# Patient Record
Sex: Male | Born: 2000 | Race: White | Hispanic: No | Marital: Single | Smoking: Never smoker
Health system: Southern US, Community
[De-identification: ages and names within clinical notes are randomized; demographics above are authoritative.]

## PROBLEM LIST (undated history)

## (undated) HISTORY — PX: FRACTURE SURGERY: SHX138

---
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
## 2020-10-06 ENCOUNTER — Ambulatory Visit (INDEPENDENT_AMBULATORY_CARE_PROVIDER_SITE_OTHER): Payer: Medicaid Other

## 2020-10-06 ENCOUNTER — Ambulatory Visit (HOSPITAL_COMMUNITY)
Admission: EM | Admit: 2020-10-06 | Discharge: 2020-10-06 | Disposition: A | Discharge status: Home / Self Care | Payer: Medicaid Other | Attending: Sports Medicine | Admitting: Sports Medicine

## 2020-10-06 ENCOUNTER — Encounter (HOSPITAL_COMMUNITY): Payer: Self-pay | Admitting: Emergency Medicine

## 2020-10-06 ENCOUNTER — Other Ambulatory Visit: Payer: Self-pay

## 2020-10-06 DIAGNOSIS — S93402A Sprain of unspecified ligament of left ankle, initial encounter: Secondary | ICD-10-CM

## 2020-10-06 DIAGNOSIS — M25572 Pain in left ankle and joints of left foot: Secondary | ICD-10-CM | POA: Diagnosis not present

## 2020-10-06 NOTE — ED Triage Notes (Signed)
Pt presents with left ankle pain after falling while skateboarding yesterday.

## 2020-10-06 NOTE — ED Provider Notes (Signed)
[facility]    CSN: [id number] Arrival date & time: 10/06/20  1625      History   Chief Complaint Chief Complaint  Patient presents with   Ankle Pain    Left    HPI Todd Farmer is a 20 y.o. male who presents for left ankle pain since yesterday, s/p fall from skateboard.  The patient states that yesterday he was skateboarding on a quarter pipe, when he went up and came to laying back down but slid off of the board as there were people standing in his landing area.  He landed off of the board and describes an inversion like ankle injury of the left foot.  He had immediate pain and felt 2 "pops" on the lateral side of his ankle. He was able  to ambulate shortly after, but it was somewhat painful.  He went to bed, and early this morning he woke up with a very swollen ankle and significant pain trying to step to walk.  He took 2 tablets of ibuprofen last night and again today with mild pain relief.  He does note significant swelling on the lateral aspect of the ankle.  Denies any erythema, open cuts, or ecchymosis noted.  His pain is located over the left lateral ankle, he denies any pain in the midfoot or digits of the foot.  His pain is worse with activity, walking, eversion/inversion of the ankle. Reports history of prior ankle sprain a few years ago on same ankle.  History reviewed. No pertinent past medical history.  There are no problems to display for this patient.   History reviewed. No pertinent surgical history.     Home Medications    Prior to Admission medications   Not on File    Family History Family History  Problem Relation Age of Onset   Asthma Mother    Healthy Father     Social History Social History   Tobacco Use   Smoking status: Never   Smokeless tobacco: Never  Substance Use Topics   Alcohol use: Yes    Comment: occasionally   Drug use: Never     Allergies   Patient has no known allergies.   Review of Systems Review of  Systems  Constitutional:  Negative for chills and fever.  Musculoskeletal:  Positive for arthralgias (Left ankle pain) and joint swelling.  Skin:  Negative for wound.       + no bruising reported    Physical Exam Triage Vital Signs ED Triage Vitals  Enc Vitals Group     BP 10/06/20 1708 (!) 149/93     Pulse Rate 10/06/20 1708 77     Resp 10/06/20 1708 16     Temp 10/06/20 1708 98.3 F (36.8 C)     Temp Source 10/06/20 1708 Oral     SpO2 10/06/20 1708 97 %     Weight --      Height --      Head Circumference --      Peak Flow --      Pain Score 10/06/20 1706 5     Pain Loc --      Pain Edu? --      Excl. in [place]? --    No data found.  Updated Vital Signs BP (!) 149/93 (BP Location: Left Arm)   Pulse 77   Temp 98.3 F (36.8 C) (Oral)   Resp 16   SpO2 97%   Physical Exam Gen: Well-appearing, in no acute  distress; non-toxic CV: Regular Rate. Well-perfused. Warm.  Resp: Breathing unlabored on room air; no wheezing. Psych: Fluid speech in conversation; appropriate affect; normal thought process  MSK:  - Left ankle: ACE wrap in place initially; removed.  There is TTP noted of the left fibula just proximal to the lateral malleolus.  TTP noted at the anterior lateral ankle just medial to the lateral malleolus.  There is no TTP over lateral or medial malleoli.  No TTP over navicular, cuboid, base of fifth metatarsal, or any of the foot metatarsal/toes.  There is notable swelling over the anterolateral ankle.  Range of motion is restricted in dorsiflexion secondary to pain; pain with inversion of the ankle and resisted eversion.  Negative anterior drawer, although guarded secondary to pain.  Mildly positive syndesmosis squeeze test distally.   UC Treatments / Results  Labs (all labs ordered are listed, but only abnormal results are displayed) Labs Reviewed - No data to display  Radiology DG Ankle Complete Left  Result Date: 10/06/2020 CLINICAL DATA:  Lateral ankle pain.  Fall  from skateboard. EXAM: LEFT ANKLE COMPLETE - 3+ VIEW COMPARISON:  Left ankle radiographs 12/04/2018 FINDINGS: Soft tissue swelling is present over the lateral malleolus. No underlying fracture is present. Ossification of the intraosseous ligament suggests prior trauma. IMPRESSION: Soft tissue swelling over the lateral malleolus without underlying fracture. Electronically Signed   By: Marin Roberts M.D.   On: 10/06/2020 17:49    Procedures Procedures (including critical care time)  Medications Ordered in UC Medications - No data to display  Initial Impression / Assessment and Plan / UC Course  I have reviewed the triage vital signs and the nursing notes.  Pertinent labs & imaging results that were available during my care of the patient were reviewed by me and considered in my medical decision making (see chart for details).     Lateral ankle sprain, left - inversion ankle injury with significant ecchymosis over lateral ankle. X-ray did not demonstrate any fracture, however prior evidence of ossification at IO ligament. He does have some tenderness at distal syndesmosis with squeeze test, with slight supsicion for syndesmotic injury. Will place him in air cast, rest and RICE therapy trying to avoid weight-bearing as possible over the next few days. Recommend f/u at  ortho office later this week for repeat imaging and evaluation, patient is understanding and agreeable. May take OTC anti-inflammatories as needed for pain control. Return precautions provided.     Final Clinical Impressions(s) / UC Diagnoses   Final diagnoses:  Acute left ankle pain  Sprain of left ankle, unspecified ligament, initial encounter   Discharge Instructions   None    ED Prescriptions   None    PDMP not reviewed this encounter.   Madelyn Brunner, DO 10/06/20 1815

## 2021-01-29 ENCOUNTER — Ambulatory Visit (HOSPITAL_COMMUNITY)
Admission: EM | Admit: 2021-01-29 | Discharge: 2021-01-29 | Disposition: A | Discharge status: Home / Self Care | Payer: BC Managed Care – PPO | Attending: Family Medicine | Admitting: Family Medicine

## 2021-01-29 ENCOUNTER — Other Ambulatory Visit: Payer: Self-pay

## 2021-01-29 ENCOUNTER — Encounter (HOSPITAL_COMMUNITY): Payer: Self-pay | Admitting: Emergency Medicine

## 2021-01-29 DIAGNOSIS — J069 Acute upper respiratory infection, unspecified: Secondary | ICD-10-CM | POA: Diagnosis not present

## 2021-01-29 LAB — POC INFLUENZA A AND B ANTIGEN (URGENT CARE ONLY)
INFLUENZA A ANTIGEN, POC: NEGATIVE
INFLUENZA B ANTIGEN, POC: NEGATIVE

## 2021-01-29 NOTE — Discharge Instructions (Addendum)
You have an upper respiratory infection, most likely due to a virus that we could not or did not test for.  Take dayquil or Nyquil for your symptoms, rest, and drink plenty of fluids.

## 2021-01-29 NOTE — ED Provider Notes (Signed)
[facility]    CSN: [id number] Arrival date & time: 01/29/21  1857      History   Chief Complaint Chief Complaint  Patient presents with   Cough    HPI Ziah Leandro is a 20 y.o. male.    Cough Here with h/o cough/congestion since 12/12. Then had fever yesterday, not measured. Threw up once after cough medicine. Has had some h/a. Did have some chills too when had the fever Home covid test was negative.  History reviewed. No pertinent past medical history.  There are no problems to display for this patient.   Past Surgical History:  Procedure Laterality Date   FRACTURE SURGERY         Home Medications    Prior to Admission medications   Not on File    Family History Family History  Problem Relation Age of Onset   Asthma Mother    Healthy Father     Social History Social History   Tobacco Use   Smoking status: Never   Smokeless tobacco: Never  Vaping Use   Vaping Use: Every day  Substance Use Topics   Alcohol use: Yes    Comment: occasionally   Drug use: Never     Allergies   Patient has no known allergies.   Review of Systems Review of Systems  Respiratory:  Positive for cough.     Physical Exam Triage Vital Signs ED Triage Vitals  Enc Vitals Group     BP 01/29/21 1917 134/88     Pulse Rate 01/29/21 1917 89     Resp 01/29/21 1917 18     Temp 01/29/21 1917 98.2 F (36.8 C)     Temp Source 01/29/21 1917 Oral     SpO2 01/29/21 1917 98 %     Weight --      Height --      Head Circumference --      Peak Flow --      Pain Score 01/29/21 1914 5     Pain Loc --      Pain Edu? --      Excl. in [place]? --    No data found.  Updated Vital Signs BP 134/88 (BP Location: Right Arm)    Pulse 89    Temp 98.2 F (36.8 C) (Oral)    Resp 18    SpO2 98%   Visual Acuity Right Eye Distance:   Left Eye Distance:   Bilateral Distance:    Right Eye Near:   Left Eye Near:    Bilateral Near:     Physical Exam   UC Treatments /  Results  Labs (all labs ordered are listed, but only abnormal results are displayed) Labs Reviewed  POC INFLUENZA A AND B ANTIGEN (URGENT CARE ONLY)    EKG   Radiology No results found.  Procedures Procedures (including critical care time)  Medications Ordered in UC Medications - No data to display  Initial Impression / Assessment and Plan / UC Course  I have reviewed the triage vital signs and the nursing notes.  Pertinent labs & imaging results that were available during my care of the patient were reviewed by me and considered in my medical decision making (see chart for details).     He related symptoms really beginning in the last 3 days, so flu test done, but was negative. Discussed the viral nature of his URI, and that it does not seem to be a sinus infection so far.  Symptomatic relief with dayquil/nyquil type meds. Final Clinical Impressions(s) / UC Diagnoses   Final diagnoses:  Viral URI with cough     Discharge Instructions      You have an upper respiratory infection, most likely due to a virus that we could not or did not test for.  Take dayquil or Nyquil for your symptoms, rest, and drink plenty of fluids.       ED Prescriptions   None    PDMP not reviewed this encounter.   Zenia Resides, MD 01/29/21 [phone]

## 2021-01-29 NOTE — ED Triage Notes (Signed)
Headache, cough and fever started 4-5 days ago.

## 2021-09-13 IMAGING — DX DG ANKLE COMPLETE 3+V*L*
3 series · 3 of 3 positions shown · non-contrast
Comparison: None.

CLINICAL DATA: Fell off skateboard, pain and swelling, lateral
malleolus pain

EXAM:
LEFT ANKLE COMPLETE - 3+ VIEW

[ankle ap]
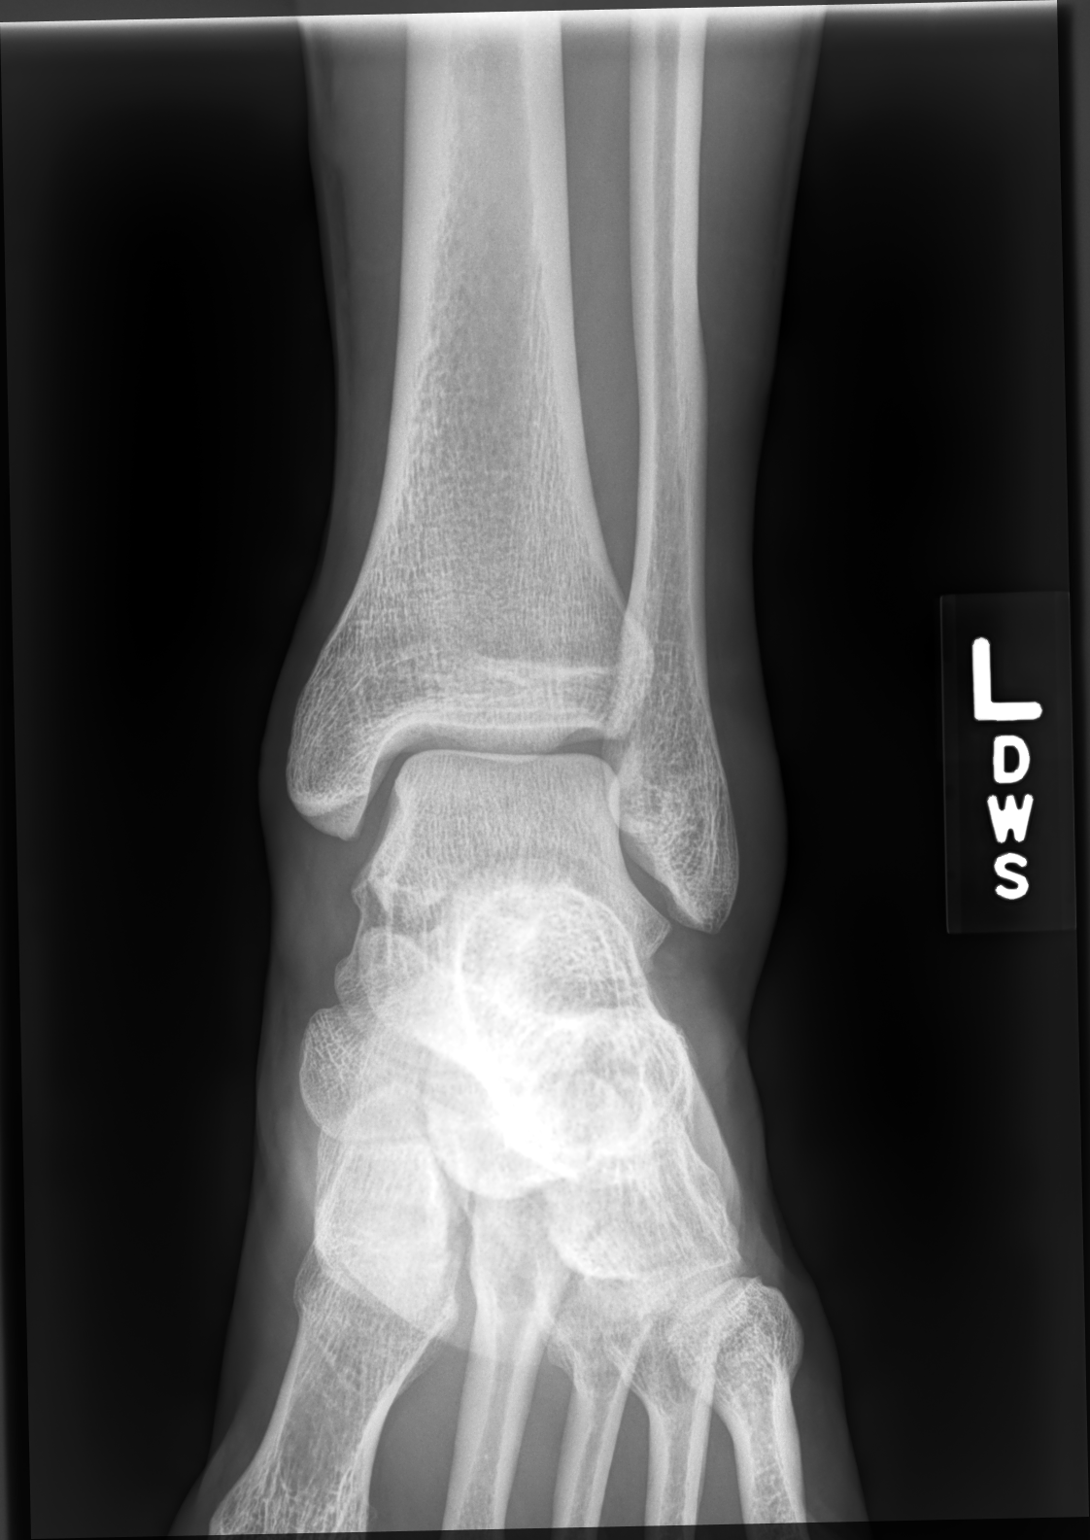

[ankle medial oblique]
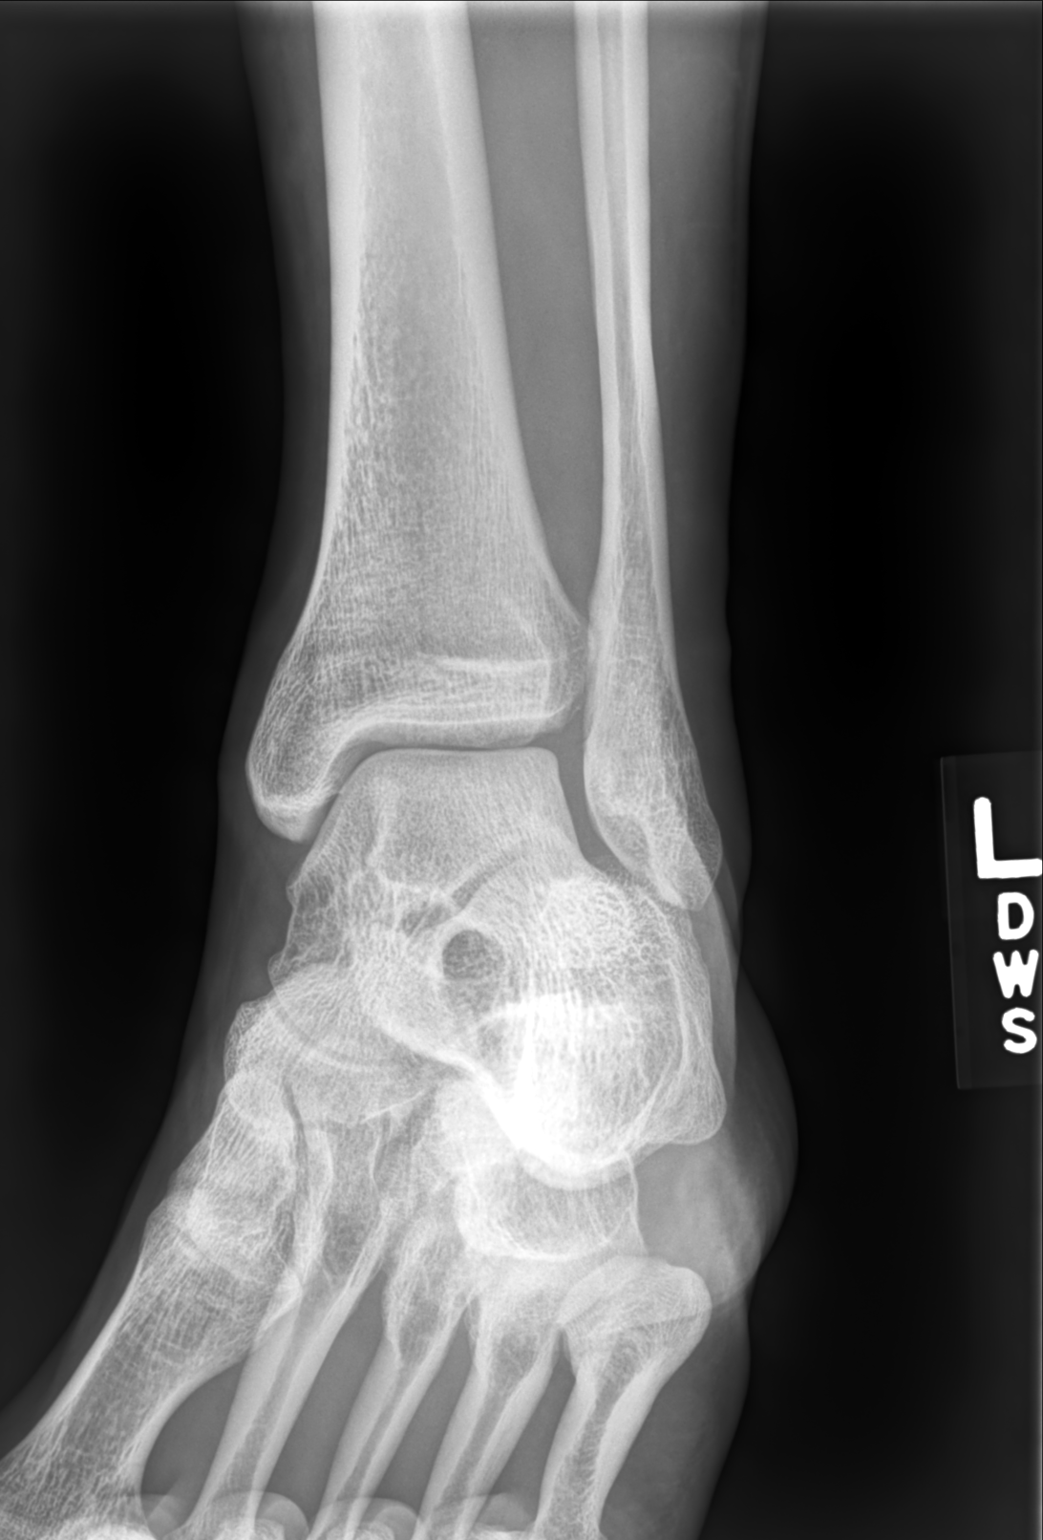

[ankle lat]
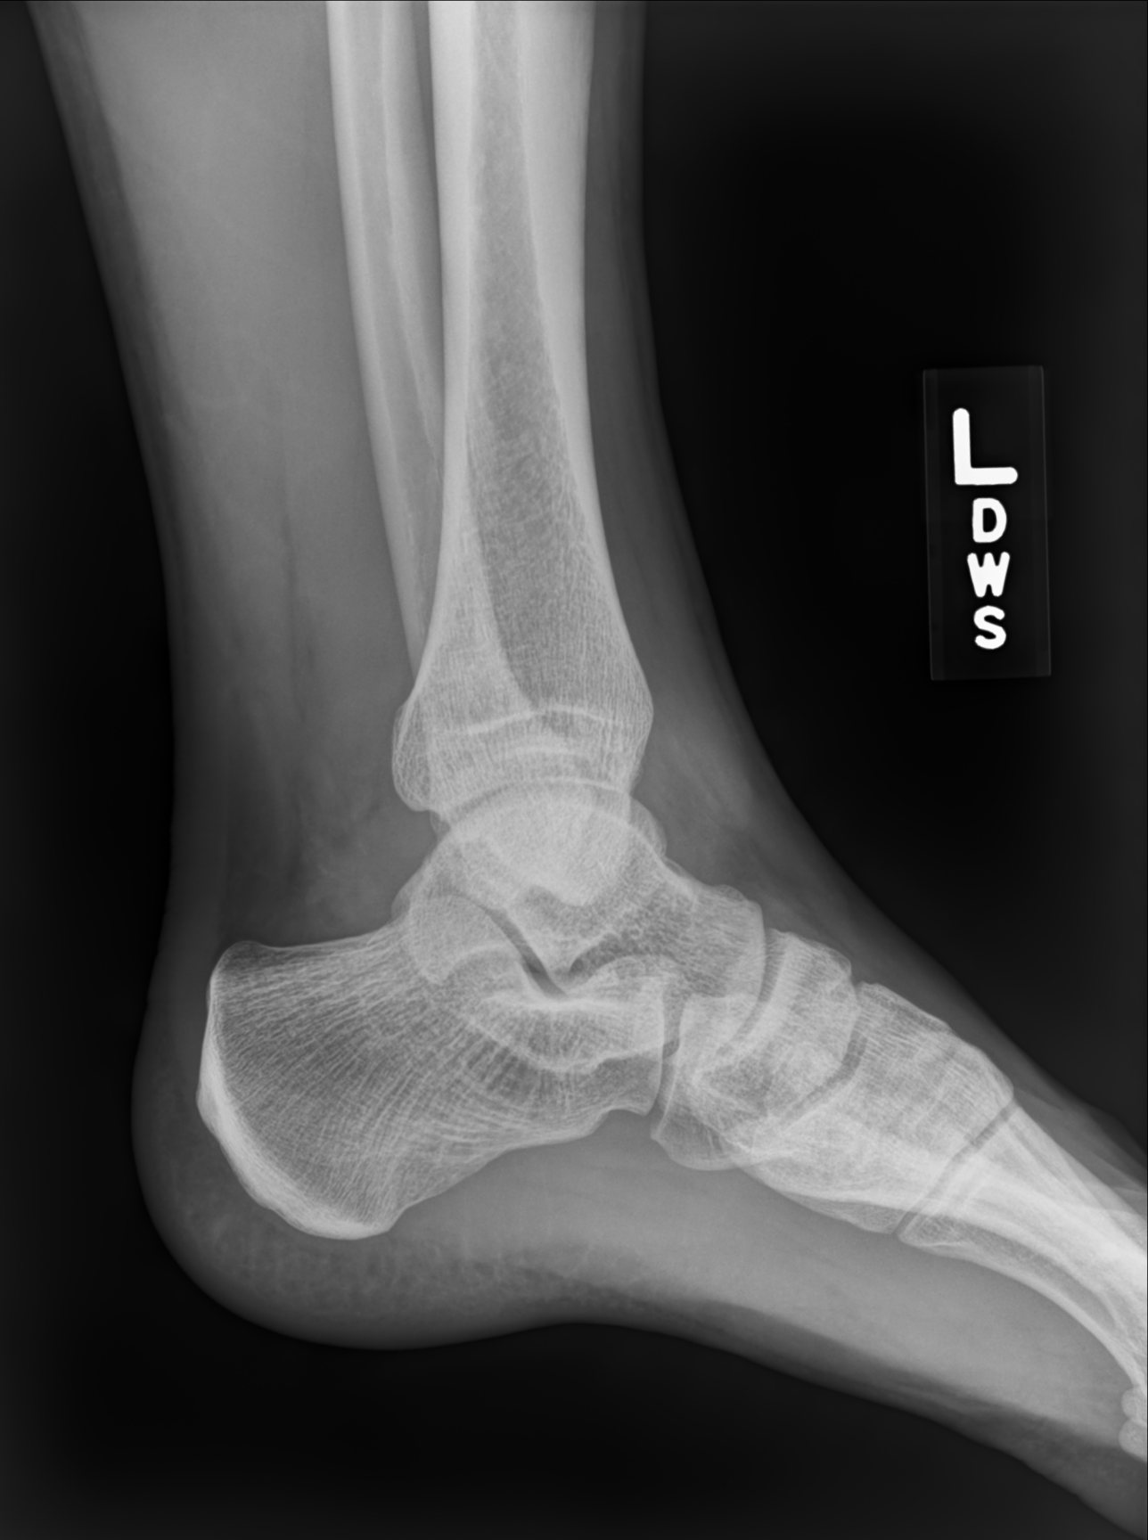

[3 of 3 positions shown; findings below may reference images not displayed]

FINDINGS: No fracture or dislocation of the left ankle. There may be widening
of the distal tibiofibular interval and ankle mortise, concerning
for interosseous ligament injury. Ankle joint effusion. Soft tissue
edema overlying the lateral ankle.
IMPRESSION: 1. No fracture or dislocation of the left ankle.

2. There may be widening of the distal tibiofibular interval and
ankle mortise, concerning for interosseous ligament injury.

3.  Ankle joint effusion.

4.  Soft tissue edema overlying the lateral ankle.

## 2023-07-17 IMAGING — DX DG ANKLE COMPLETE 3+V*L*
3 series · 3 of 3 positions shown · non-contrast
Comparison: Left ankle radiographs 12/04/2018

CLINICAL DATA: Lateral ankle pain.  Fall from skateboard.

EXAM:
LEFT ANKLE COMPLETE - 3+ VIEW

[ankle ap]
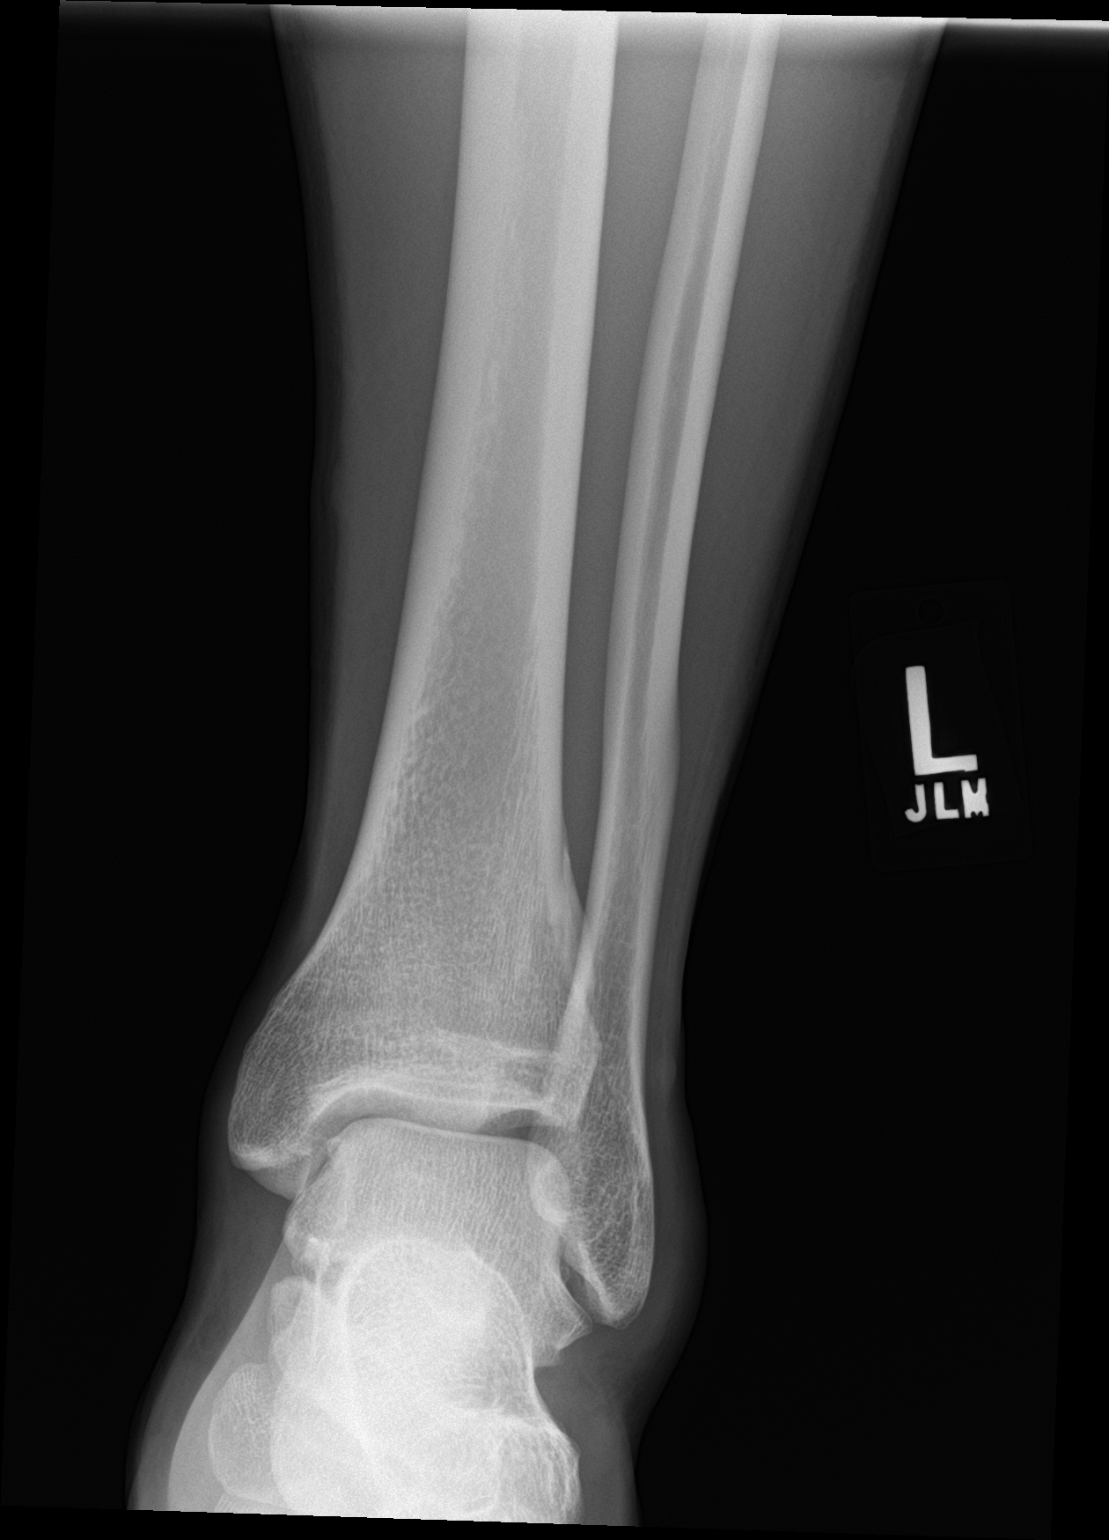

[ankle obl]
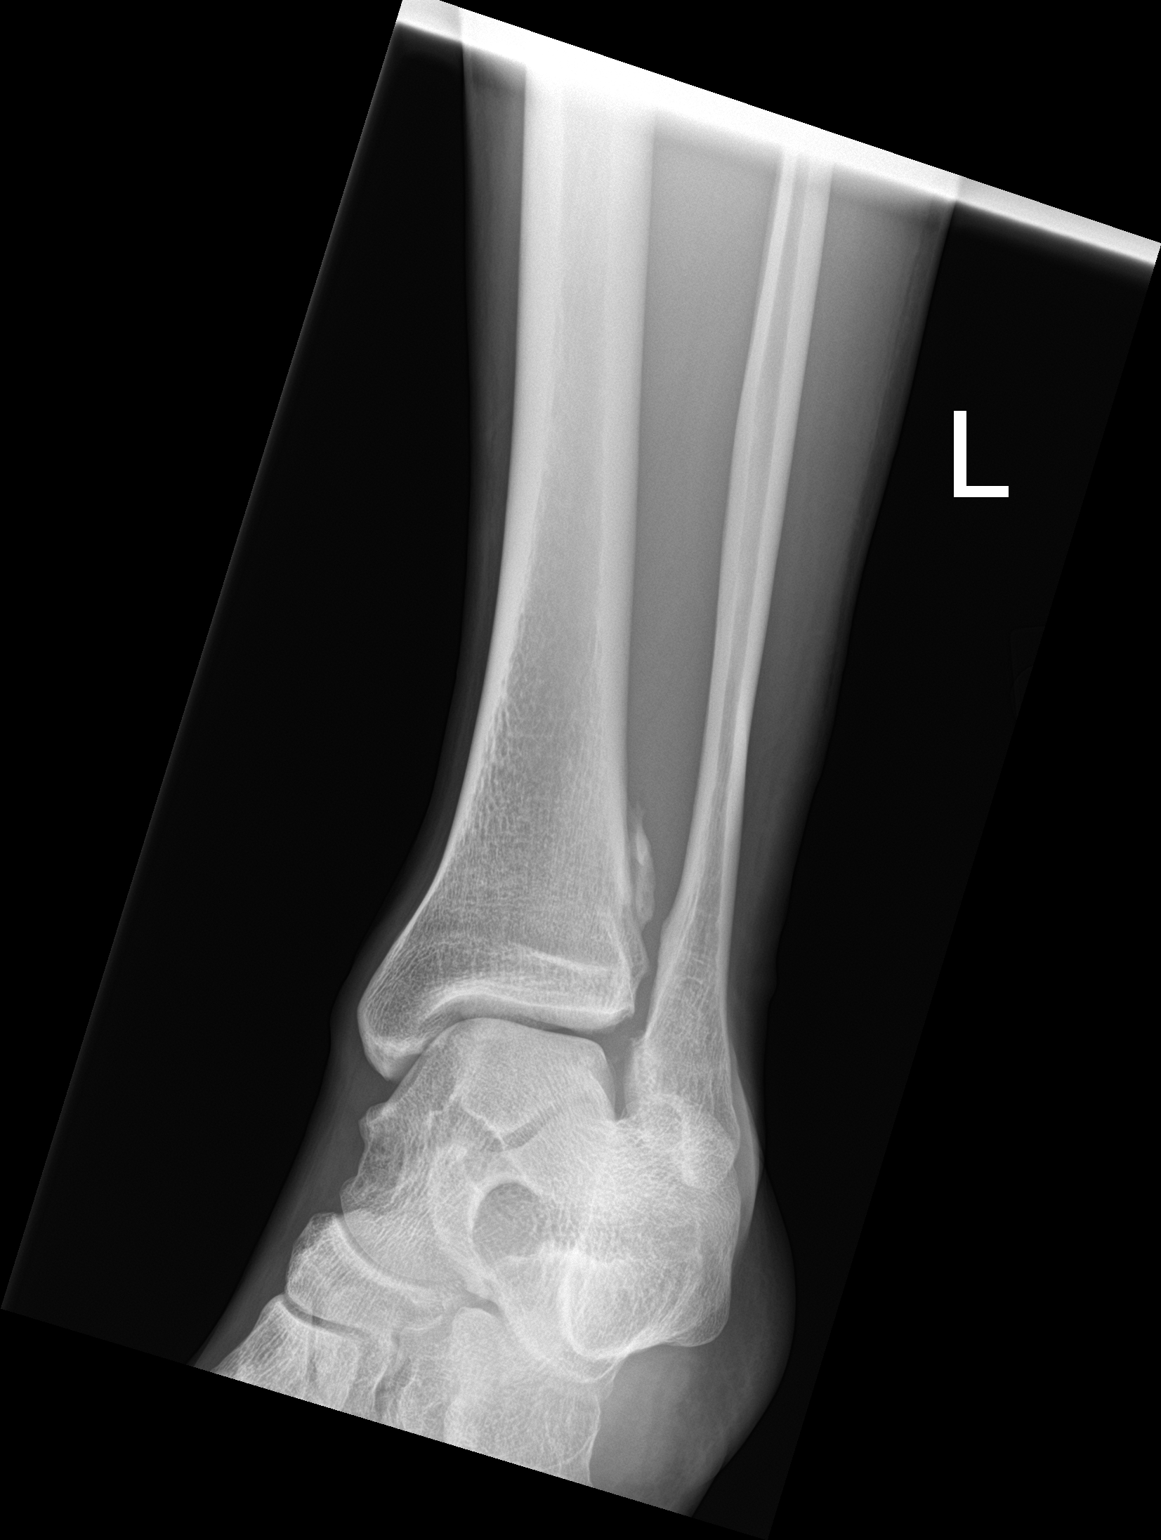

[ankle lat]
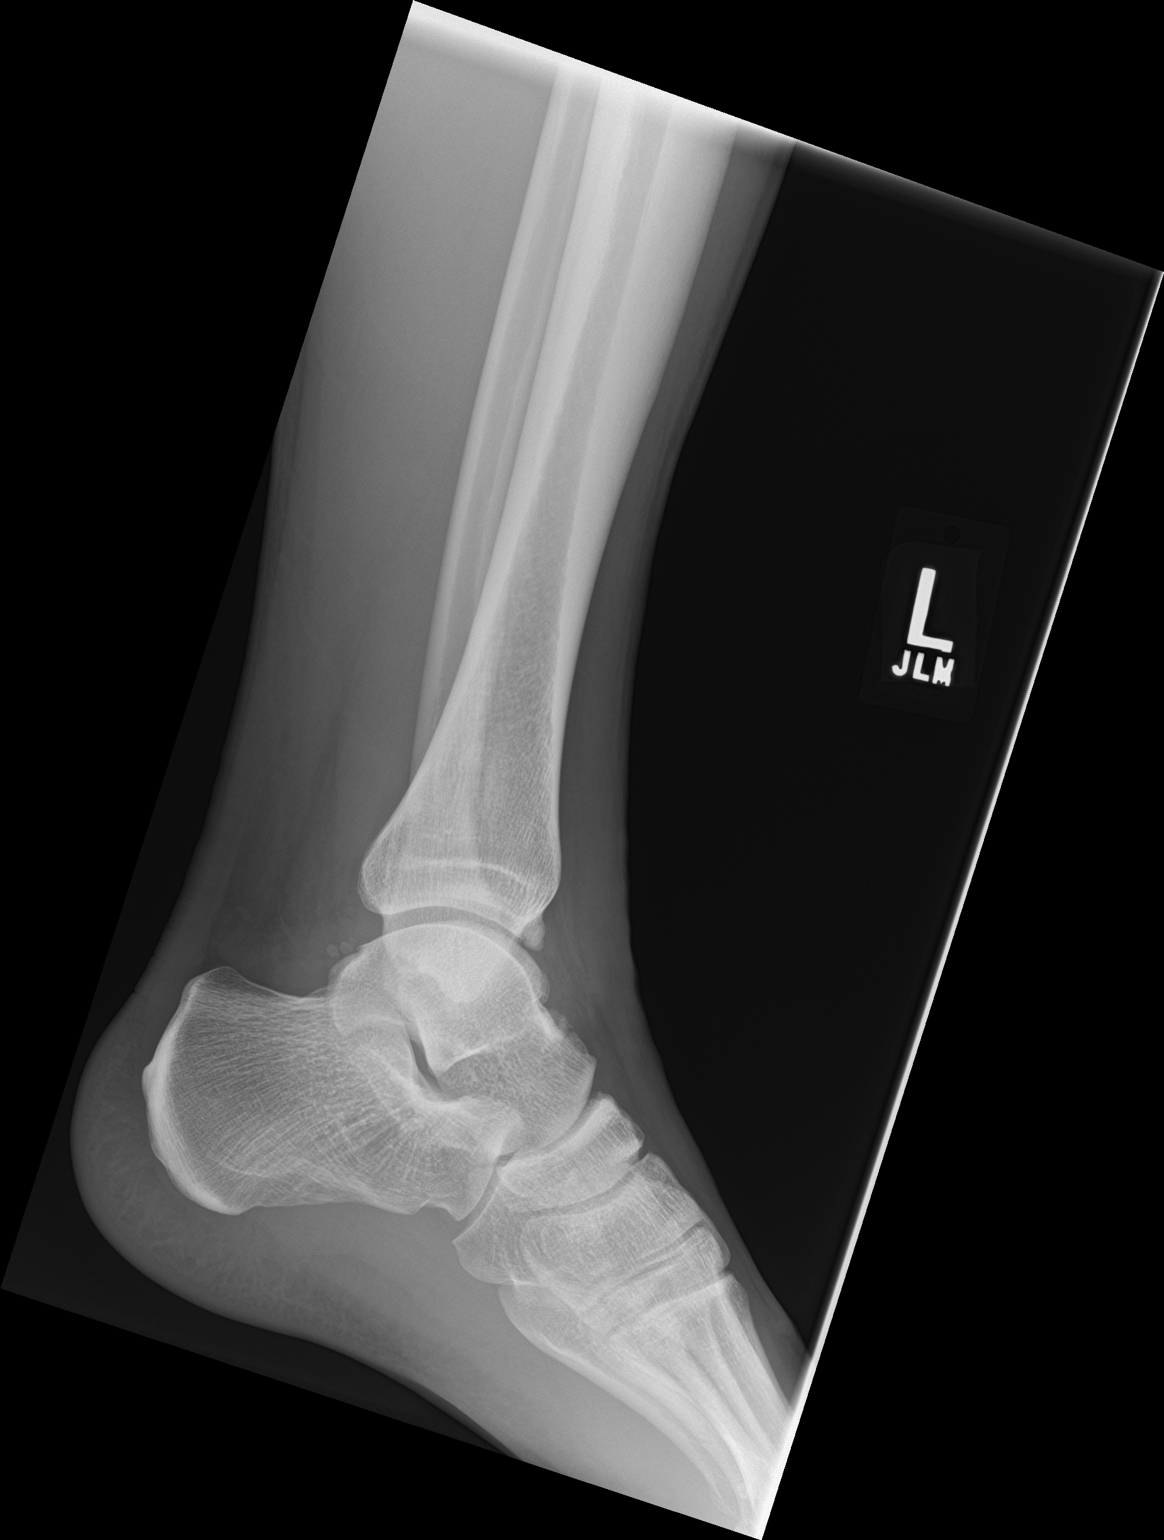

[3 of 3 positions shown; findings below may reference images not displayed]

FINDINGS: Soft tissue swelling is present over the lateral malleolus. No
underlying fracture is present. Ossification of the intraosseous
ligament suggests prior trauma.
IMPRESSION: Soft tissue swelling over the lateral malleolus without underlying
fracture.
